# Patient Record
Sex: Female | Born: 1980 | Race: White | Hispanic: No | Marital: Single | Smoking: Current every day smoker
Health system: Southern US, Community
[De-identification: ages and names within clinical notes are randomized; demographics above are authoritative.]

## PROBLEM LIST (undated history)

## (undated) HISTORY — PX: BACK SURGERY: SHX140

## (undated) HISTORY — PX: TONSILLECTOMY: SUR1361

---

## 2018-05-07 ENCOUNTER — Emergency Department (HOSPITAL_COMMUNITY): Payer: Medicaid Other

## 2018-05-07 ENCOUNTER — Encounter (HOSPITAL_COMMUNITY): Payer: Self-pay | Admitting: Emergency Medicine

## 2018-05-07 ENCOUNTER — Other Ambulatory Visit: Payer: Self-pay

## 2018-05-07 ENCOUNTER — Emergency Department (HOSPITAL_COMMUNITY)
Admission: EM | Admit: 2018-05-07 | Discharge: 2018-05-07 | Disposition: A | Discharge status: Home / Self Care | Payer: Medicaid Other | Attending: Emergency Medicine | Admitting: Emergency Medicine

## 2018-05-07 DIAGNOSIS — K08409 Partial loss of teeth, unspecified cause, unspecified class: Secondary | ICD-10-CM

## 2018-05-07 DIAGNOSIS — K047 Periapical abscess without sinus: Secondary | ICD-10-CM

## 2018-05-07 DIAGNOSIS — F17210 Nicotine dependence, cigarettes, uncomplicated: Secondary | ICD-10-CM | POA: Insufficient documentation

## 2018-05-07 DIAGNOSIS — K0889 Other specified disorders of teeth and supporting structures: Secondary | ICD-10-CM | POA: Diagnosis not present

## 2018-05-07 MED ORDER — LIDOCAINE-EPINEPHRINE (PF) 2 %-1:200000 IJ SOLN
20.0000 mL | Freq: Once | INTRAMUSCULAR | Status: AC
  Administered 2018-05-07: 20 mL

## 2018-05-07 NOTE — ED Triage Notes (Signed)
Pt reports l lower dental pain x5 days.  Reports she was  referred by her dentist to Dr Teola Bradley who's going to do a dental extraction here in the ER

## 2018-05-07 NOTE — ED Provider Notes (Signed)
[name] EMERGENCY DEPARTMENT Provider Note   CSN: [id number] Arrival date & time: 05/07/18  4481    History   Chief Complaint Chief Complaint  Patient presents with  . Dental Pain    HPI Kimberly Ho is a 38 y.o. female.     38yo female presents with left lower dental pain. Patient was seen by her dentist and given amoxacillin and Motrin for her pain proximately 3 weeks ago, states that her pain had improved and then pain returned 4 days ago and she started on another course of amoxicillin.  Patient called her dentist who had referred her to see Dr. Barbette Merino with oral surgery in [facility] for ongoing pain however the office is closed due to coronavirus so patient was told to come to the emergency room and Dr. Barbette Merino would see her in the ER.  Patient feels like her left lower jaw is swollen however does not see swelling, denies drainage, denies fever, denies any previous trauma to the area.  No other complaints or concerns.     History reviewed. No pertinent past medical history.  There are no active problems to display for this patient.   Past Surgical History:  Procedure Laterality Date  . BACK SURGERY    . CESAREAN SECTION    . TONSILLECTOMY       OB History   No obstetric history on file.      Home Medications    Prior to Admission medications   Not on File    Family History No family history on file.  Social History Social History   Tobacco Use  . Smoking status: Current Every Day Smoker  . Smokeless tobacco: Never Used  Substance Use Topics  . Alcohol use: Yes  . Drug use: Never     Allergies   Patient has no known allergies.   Review of Systems Review of Systems  Constitutional: Negative for chills and fever.  HENT: Positive for dental problem. Negative for facial swelling, trouble swallowing and voice change.   Gastrointestinal: Negative for vomiting.  Musculoskeletal: Negative for neck pain and neck stiffness.   Skin: Negative for rash and wound.  Allergic/Immunologic: Negative for immunocompromised state.  Neurological: Negative for headaches.  Hematological: Negative for adenopathy.  Psychiatric/Behavioral: Negative for confusion.  All other systems reviewed and are negative.    Physical Exam Updated Vital Signs BP (!) 142/97 (BP Location: Right Arm)   Pulse 99   Temp 98.1 F (36.7 C) (Oral)   Resp 18   Ht 5' (1.524 m)   Wt 61.2 kg   LMP 05/07/2018   SpO2 100%   BMI 26.37 kg/m   Physical Exam Vitals signs and nursing note reviewed.  Constitutional:      General: She is not in acute distress.    Appearance: She is well-developed. She is not diaphoretic.  HENT:     Head: Normocephalic and atraumatic.     Jaw: No trismus.      Nose: Nose normal.     Mouth/Throat:     Mouth: Mucous membranes are moist.     Dentition: Dental caries present.     Pharynx: No oropharyngeal exudate or posterior oropharyngeal erythema.   Neck:     Musculoskeletal: Neck supple.  Pulmonary:     Effort: Pulmonary effort is normal.  Lymphadenopathy:     Cervical: No cervical adenopathy.  Neurological:     Mental Status: She is alert and oriented to person, place, and time.  Psychiatric:  Behavior: Behavior normal.      ED Treatments / Results  Labs (all labs ordered are listed, but only abnormal results are displayed) Labs Reviewed - No data to display  EKG None  Radiology Dg Orthopantogram  Result Date: 05/07/2018 CLINICAL DATA:  LEFT lower dental pain for days. EXAM: ORTHOPANTOGRAM/PANORAMIC COMPARISON:  None. FINDINGS: There is questionable periapical lucency surrounding the roots of a LEFT lower second molar, marked by an arrow. No definite dental caries. IMPRESSION: Questionable periapical lucency surrounding LEFT lower molar. Correlate clinically. Electronically Signed   By: Elsie Stain M.D.   On: 05/07/2018 10:10    Procedures Procedures (including critical care time)   Medications Ordered in ED Medications  lidocaine-EPINEPHrine (XYLOCAINE W/EPI) 2 %-1:200000 (PF) injection 20 mL (20 mLs Other Given 05/07/18 1114)     Initial Impression / Assessment and Plan / ED Course  I have reviewed the triage vital signs and the nursing notes.  Pertinent labs & imaging results that were available during my care of the patient were reviewed by me and considered in my medical decision making (see chart for details).  Clinical Course as of May 06 1116  Sat May 07, 2018  1453 38 year old female presents to the ER with left lower dental pain, called her dentist who reported Dr. Barbette Merino with oral surgery would come into the ER to see her.  Coming to Dr. Randa Evens call service, Dr. Barbette Merino is aware and will come and see the patient.   [LM]  1118 Patient was seen by Dr. Barbette Merino, tooth extracted.   [LM]    Clinical Course User Index [LM] Jeannie Fend, PA-C   Final Clinical Impressions(s) / ED Diagnoses   Final diagnoses:  Pain, dental  S/P tooth extraction    ED Discharge Orders    None       Jeannie Fend, PA-C 05/07/18 1118    Margarita Grizzle, MD 05/07/18 [phone]

## 2018-05-07 NOTE — H&P (Signed)
HISTORY AND PHYSICAL  Kimberly Ho is a 38 y.o. female patient with CC: dental pain  \HPI: Pain for 5 days. On amoxicillin, ibuprofen, tylenol, with no relief  1. Dental abscess     History reviewed. No pertinent past medical history.  Current Facility-Administered Medications  Medication Dose Route Frequency Provider Last Rate Last Dose  . lidocaine-EPINEPHrine (XYLOCAINE W/EPI) 2 %-1:200000 (PF) injection 20 mL  20 mL Other Once Jeannie Fend, PA-C       No current outpatient medications on file.   No Known Allergies Active Problems:   * No active hospital problems. *  Vitals: Blood pressure (!) 142/97, pulse 99, temperature 98.1 F (36.7 C), temperature source Oral, resp. rate 18, height 5' (1.524 m), weight 61.2 kg, last menstrual period 05/07/2018, SpO2 100 %. Lab results:No results found for this or any previous visit (from the past 24 hour(s)). Radiology Results: Dg Orthopantogram  Result Date: 05/07/2018 CLINICAL DATA:  LEFT lower dental pain for days. EXAM: ORTHOPANTOGRAM/PANORAMIC COMPARISON:  None. FINDINGS: There is questionable periapical lucency surrounding the roots of a LEFT lower second molar, marked by an arrow. No definite dental caries. IMPRESSION: Questionable periapical lucency surrounding LEFT lower molar. Correlate clinically. Electronically Signed   By: Elsie Stain M.D.   On: 05/07/2018 10:10   General appearance: alert, cooperative and no distress Head: Normocephalic, without obvious abnormality, atraumatic Eyes: negative Nose: Nares normal. Septum midline. Mucosa normal. No drainage or sinus tenderness. Throat: lips, mucosa, and tongue normal; teeth and gums normal and large carious lesion toolthb #18. No purulence,m edema, trismus. Neck: no adenopathy, supple, symmetrical, trachea midline and thyroid not enlarged, symmetric, no tenderness/mass/nodules  Assessment: Abscessed tooth #18  Plan: Extraction tooth #18. Local anesthesia   Ocie Doyne 05/07/2018

## 2018-05-07 NOTE — ED Notes (Signed)
Patient transported to X-ray 

## 2018-05-07 NOTE — Procedures (Signed)
Local anesthesia lidocaine 2% 1:200,000 epi x 5 cc left inferior alveolar block. Simple forcep extraction tooth #18. Tolerated well. No complications. EBL min. POI given

## 2018-05-07 NOTE — ED Notes (Signed)
Pt returns from radiology. 

## 2020-02-13 ENCOUNTER — Ambulatory Visit
Admission: EM | Admit: 2020-02-13 | Discharge: 2020-02-13 | Disposition: A | Discharge status: Home / Self Care | Payer: Medicaid Other | Attending: Family Medicine | Admitting: Family Medicine

## 2020-02-13 ENCOUNTER — Other Ambulatory Visit: Payer: Self-pay

## 2020-02-13 ENCOUNTER — Ambulatory Visit (INDEPENDENT_AMBULATORY_CARE_PROVIDER_SITE_OTHER): Payer: Medicaid Other

## 2020-02-13 DIAGNOSIS — R0682 Tachypnea, not elsewhere classified: Secondary | ICD-10-CM | POA: Diagnosis not present

## 2020-02-13 DIAGNOSIS — Z1152 Encounter for screening for COVID-19: Secondary | ICD-10-CM | POA: Diagnosis not present

## 2020-02-13 DIAGNOSIS — R0602 Shortness of breath: Secondary | ICD-10-CM | POA: Diagnosis not present

## 2020-02-13 DIAGNOSIS — R062 Wheezing: Secondary | ICD-10-CM | POA: Diagnosis not present

## 2020-02-13 DIAGNOSIS — J209 Acute bronchitis, unspecified: Secondary | ICD-10-CM

## 2020-02-13 DIAGNOSIS — R059 Cough, unspecified: Secondary | ICD-10-CM | POA: Diagnosis not present

## 2020-02-13 DIAGNOSIS — R5383 Other fatigue: Secondary | ICD-10-CM

## 2020-02-13 MED ORDER — METHYLPREDNISOLONE SODIUM SUCC 125 MG IJ SOLR
125.0000 mg | Freq: Once | INTRAMUSCULAR | Status: AC
  Administered 2020-02-13: 125 mg via INTRAMUSCULAR

## 2020-02-13 MED ORDER — PREDNISONE 10 MG (21) PO TBPK: ORAL_TABLET | Freq: Every day | ORAL | 0 refills | Status: AC

## 2020-02-13 MED ORDER — ALBUTEROL SULFATE HFA 108 (90 BASE) MCG/ACT IN AERS
2.0000 | INHALATION_SPRAY | Freq: Once | RESPIRATORY_TRACT | Status: AC
  Administered 2020-02-13: 2 via RESPIRATORY_TRACT

## 2020-02-13 MED ORDER — CHERATUSSIN AC 100-10 MG/5ML PO SOLN: 5.0000 mL | Freq: Three times a day (TID) | ORAL | 0 refills | Status: AC | PRN

## 2020-02-13 NOTE — Discharge Instructions (Signed)
Chest xray is negative for pneumonia  You have received a steroid injection in the office today  You have received albuterol in the office today  I have sent in a prednisone taper for you to take for 6 days. 6 tablets on day one, 5 tablets on day two, 4 tablets on day three, 3 tablets on day four, 2 tablets on day five, and 1 tablet on day six.  I have sent in cough syrup for you to take. This medication can make you sleepy. Do not drive while taking this medication.  Your COVID and Flu tests are pending.  You should self quarantine until the test results are back.    Take Tylenol or ibuprofen as needed for fever or discomfort.  Rest and keep yourself hydrated.    Follow-up with your primary care provider if your symptoms are not improving.

## 2020-02-13 NOTE — ED Triage Notes (Signed)
Pt presents with complaints of shortness of breath and cough that started on 12/23. Reports cough is dry and she is unable to cough much up. Denies any fever. Pt is not concerned for COVID.

## 2020-02-13 NOTE — ED Provider Notes (Incomplete)
[facility]    CSN: [id number] Arrival date & time: 02/13/20  1804      History   Chief Complaint Chief Complaint  Patient presents with  . Shortness of Breath    HPI Kimberly Ho is a 40 y.o. female.   HPI  History reviewed. No pertinent past medical history.  There are no problems to display for this patient.   Past Surgical History:  Procedure Laterality Date  . BACK SURGERY    . CESAREAN SECTION    . TONSILLECTOMY      OB History   No obstetric history on file.      Home Medications    Prior to Admission medications   Medication Sig Start Date End Date Taking? Authorizing Provider  guaiFENesin-codeine (CHERATUSSIN AC) 100-10 MG/5ML syrup Take 5 mLs by mouth 3 (three) times daily as needed for cough. 02/13/20  Yes Moshe Cipro, NP  predniSONE (STERAPRED UNI-PAK 21 TAB) 10 MG (21) TBPK tablet Take by mouth daily for 6 days. Take 6 tablets on day 1, 5 tablets on day 2, 4 tablets on day 3, 3 tablets on day 4, 2 tablets on day 5, 1 tablet on day 6 02/13/20 02/19/20 Yes Moshe Cipro, NP    Family History Family History  Problem Relation Age of Onset  . Heart disease Mother   . Diabetes Father     Social History Social History   Tobacco Use  . Smoking status: Current Every Day Smoker  . Smokeless tobacco: Never Used  Substance Use Topics  . Alcohol use: Yes  . Drug use: Never     Allergies   Patient has no known allergies.   Review of Systems Review of Systems   Physical Exam Triage Vital Signs ED Triage Vitals  Enc Vitals Group     BP 02/13/20 1947 126/87     Pulse Rate 02/13/20 1947 95     Resp 02/13/20 1947 (!) 26     Temp 02/13/20 1947 99 F (37.2 C)     Temp src --      SpO2 02/13/20 1947 91 %     Weight --      Height --      Head Circumference --      Peak Flow --      Pain Score 02/13/20 1945 0     Pain Loc --      Pain Edu? --      Excl. in GC? --    No data found.  Updated Vital Signs BP  126/87   Pulse 95   Temp 99 F (37.2 C)   Resp (!) 26   LMP 02/13/2020   SpO2 91%   Visual Acuity Right Eye Distance:   Left Eye Distance:   Bilateral Distance:    Right Eye Near:   Left Eye Near:    Bilateral Near:     Physical Exam   UC Treatments / Results  Labs (all labs ordered are listed, but only abnormal results are displayed) Labs Reviewed  COVID-19, FLU A+B NAA    EKG   Radiology DG Chest 2 View  Result Date: 02/13/2020 CLINICAL DATA:  Shortness of breath EXAM: CHEST - 2 VIEW COMPARISON:  None. FINDINGS: Central airways thickening. No focal consolidation or effusion. Normal heart size. No pneumothorax. IMPRESSION: Central airways thickening suggesting bronchitis or reactive airways. No focal pulmonary infiltrate. Electronically Signed   By: Jasmine Pang M.D.   On: 02/13/2020 20:09  Procedures Procedures (including critical care time)  Medications Ordered in UC Medications  methylPREDNISolone sodium succinate (SOLU-MEDROL) 125 mg/2 mL injection 125 mg (125 mg Intramuscular Given 02/13/20 2004)  albuterol (VENTOLIN HFA) 108 (90 Base) MCG/ACT inhaler 2 puff (2 puffs Inhalation Given 02/13/20 2004)    Initial Impression / Assessment and Plan / UC Course  I have reviewed the triage vital signs and the nursing notes.  Pertinent labs & imaging results that were available during my care of the patient were reviewed by me and considered in my medical decision making (see chart for details).     *** Final Clinical Impressions(s) / UC Diagnoses   Final diagnoses:  Tachypnea  SOB (shortness of breath)  Cough  Other fatigue  Wheezing  Acute bronchitis, unspecified organism  Encounter for screening for COVID-19     Discharge Instructions     Chest xray is negative for pneumonia  You have received a steroid injection in the office today  You have received albuterol in the office today  I have sent in a prednisone taper for you to take for 6 days. 6  tablets on day one, 5 tablets on day two, 4 tablets on day three, 3 tablets on day four, 2 tablets on day five, and 1 tablet on day six.  I have sent in cough syrup for you to take. This medication can make you sleepy. Do not drive while taking this medication.  Your COVID and Flu tests are pending.  You should self quarantine until the test results are back.    Take Tylenol or ibuprofen as needed for fever or discomfort.  Rest and keep yourself hydrated.    Follow-up with your primary care provider if your symptoms are not improving.       ED Prescriptions    Medication Sig Dispense Auth. Provider   predniSONE (STERAPRED UNI-PAK 21 TAB) 10 MG (21) TBPK tablet Take by mouth daily for 6 days. Take 6 tablets on day 1, 5 tablets on day 2, 4 tablets on day 3, 3 tablets on day 4, 2 tablets on day 5, 1 tablet on day 6 21 tablet Moshe Cipro, NP   guaiFENesin-codeine (CHERATUSSIN AC) 100-10 MG/5ML syrup Take 5 mLs by mouth 3 (three) times daily as needed for cough. 120 mL Moshe Cipro, NP     PDMP not reviewed this encounter.

## 2020-02-16 LAB — COVID-19, FLU A+B NAA
Influenza A, NAA: NOT DETECTED
Influenza B, NAA: NOT DETECTED
SARS-CoV-2, NAA: NOT DETECTED

## 2020-02-16 NOTE — ED Provider Notes (Signed)
[facility]   [id number] 02/13/20 Arrival Time: 1804   CC: COVID symptoms  SUBJECTIVE: History from: patient.  Kimberly Ho is a 40 y.o. female who presents with SOB, cough, congestion x 10 days. Denies sick exposure to COVID, flu or strep. Denies recent travel. Has negative history of Covid. Has not completed Covid vaccines. Cough and SOB are wore with activity. Has not taken OTC medications for this. There are no aggravating or alleviating factors. Denies previous symptoms in the past. Denies fever, chills, sinus pain, rhinorrhea, sore throat, SOB, wheezing, chest pain, nausea, changes in bowel or bladder habits.    ROS: As per HPI.  All other pertinent ROS negative.     History reviewed. No pertinent past medical history. Past Surgical History:  Procedure Laterality Date  . BACK SURGERY    . CESAREAN SECTION    . TONSILLECTOMY     No Known Allergies No current facility-administered medications on file prior to encounter.   No current outpatient medications on file prior to encounter.   Social History   Socioeconomic History  . Marital status: Single    Spouse name: Not on file  . Number of children: Not on file  . Years of education: Not on file  . Highest education level: Not on file  Occupational History  . Not on file  Tobacco Use  . Smoking status: Current Every Day Smoker  . Smokeless tobacco: Never Used  Substance and Sexual Activity  . Alcohol use: Yes  . Drug use: Never  . Sexual activity: Not on file  Other Topics Concern  . Not on file  Social History Narrative  . Not on file   Social Determinants of Health   Financial Resource Strain: Not on file  Food Insecurity: Not on file  Transportation Needs: Not on file  Physical Activity: Not on file  Stress: Not on file  Social Connections: Not on file  Intimate Partner Violence: Not on file   Family History  Problem Relation Age of Onset  . Heart disease Mother   . Diabetes Father      OBJECTIVE:  Vitals:   02/13/20 1947  BP: 126/87  Pulse: 95  Resp: (!) 26  Temp: 99 F (37.2 C)  SpO2: 91%     General appearance: alert; appears fatigued, but nontoxic; speaking in full sentences and tolerating own secretions HEENT: NCAT; Ears: EACs clear, TMs pearly gray; Eyes: PERRL.  EOM grossly intact. Sinuses: nontender; Nose: nares patent with clear rhinorrhea, Throat: oropharynx erythematous, cobblestoning present, tonsils non erythematous or enlarged, uvula midline  Neck: supple without LAD Lungs: unlabored respirations, symmetrical air entry; cough: moderate; no respiratory distress; diffuse wheezing throughout bilateral lung fields, diminished lung sounds to bilateral bases Heart: regular rate and rhythm.  Radial pulses 2+ symmetrical bilaterally Skin: warm and dry Psychological: alert and cooperative; normal mood and affect  LABS:  No results found for this or any previous visit (from the past 24 hour(s)).   ASSESSMENT & PLAN:  1. Acute bronchitis, unspecified organism   2. Tachypnea   3. SOB (shortness of breath)   4. Cough   5. Other fatigue   6. Wheezing   7. Encounter for screening for COVID-19     Meds ordered this encounter  Medications  . methylPREDNISolone sodium succinate (SOLU-MEDROL) 125 mg/2 mL injection 125 mg  . albuterol (VENTOLIN HFA) 108 (90 Base) MCG/ACT inhaler 2 puff  . predniSONE (STERAPRED UNI-PAK 21 TAB) 10 MG (21) TBPK tablet  Sig: Take by mouth daily for 6 days. Take 6 tablets on day 1, 5 tablets on day 2, 4 tablets on day 3, 3 tablets on day 4, 2 tablets on day 5, 1 tablet on day 6    Dispense:  21 tablet    Refill:  0    Order Specific Question:   Supervising Provider    Answer:   Merrilee Jansky [id number]  . guaiFENesin-codeine (CHERATUSSIN AC) 100-10 MG/5ML syrup    Sig: Take 5 mLs by mouth 3 (three) times daily as needed for cough.    Dispense:  120 mL    Refill:  0    Order Specific Question:   Supervising Provider     Answer:   Merrilee Jansky [[id number]]   Solumedrol 125mg  IM in office today Albuterol inhaler in office today Prescribed steroid taper Prescribed Cheratussin  Continue supportive care at home COVID and flu testing ordered.  It will take between 2-3 days for test results. Someone will contact you regarding abnormal results.   Work note provided Patient should remain in quarantine until they have received Covid results.  If negative you may resume normal activities (go back to work/school) while practicing hand hygiene, social distance, and mask wearing.  If positive, patient should remain in quarantine for at least 5 days from symptom onset AND greater than 72 hours after symptoms resolution (absence of fever without the use of fever-reducing medication and improvement in respiratory symptoms), whichever is longer Get plenty of rest and push fluids Use OTC zyrtec for nasal congestion, runny nose, and/or sore throat Use OTC flonase for nasal congestion and runny nose Use medications daily for symptom relief Use OTC medications like ibuprofen or tylenol as needed fever or pain Call or go to the ED if you have any new or worsening symptoms such as fever, worsening cough, shortness of breath, chest tightness, chest pain, turning blue, changes in mental status.  Reviewed expectations re: course of current medical issues. Questions answered. Outlined signs and symptoms indicating need for more acute intervention. Patient verbalized understanding. After Visit Summary given.         , NP 02/16/20 1300

## 2021-07-29 IMAGING — DX DG CHEST 2V
2 series · 2 of 2 positions shown · non-contrast
Comparison: None.

CLINICAL DATA: Shortness of breath

EXAM:
CHEST - 2 VIEW

[chest pa]
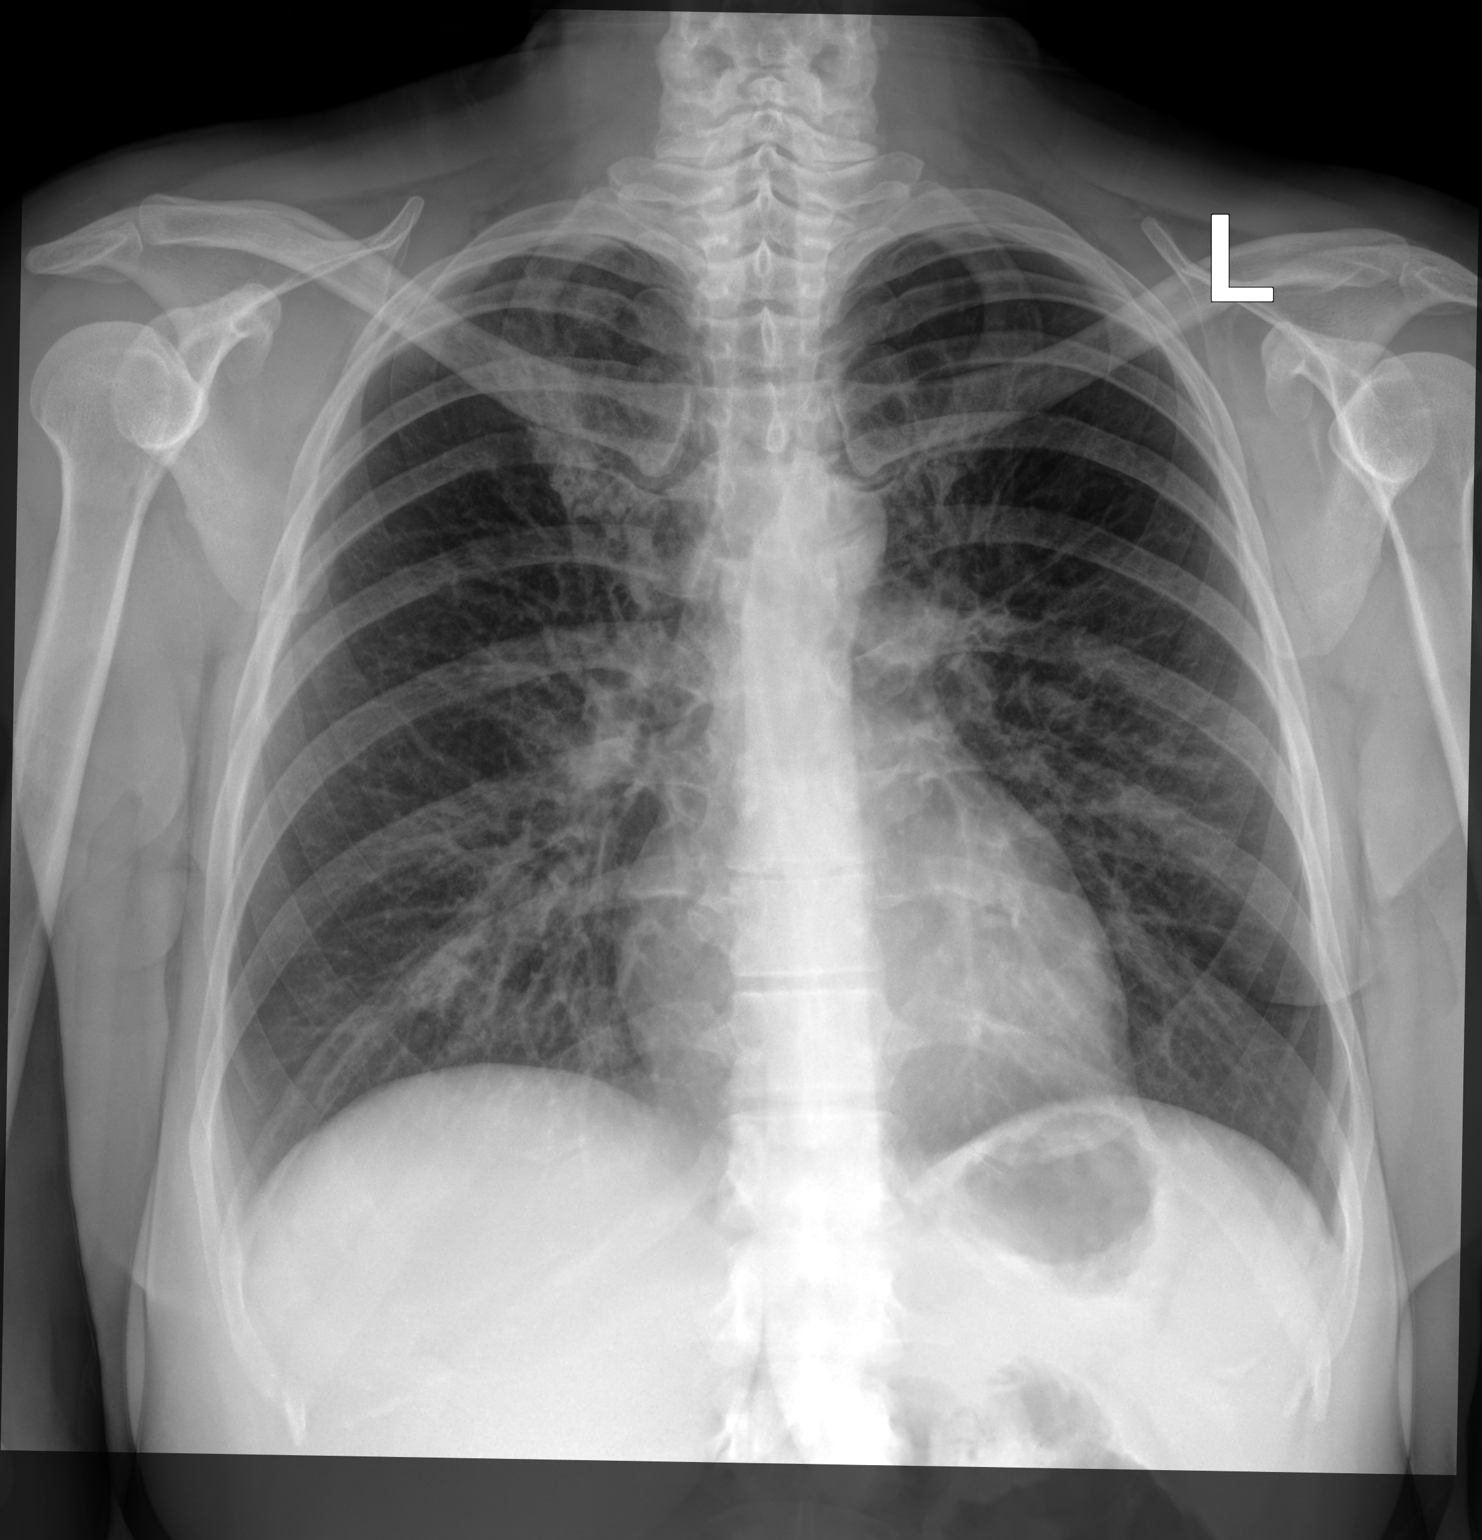

[chest lat]
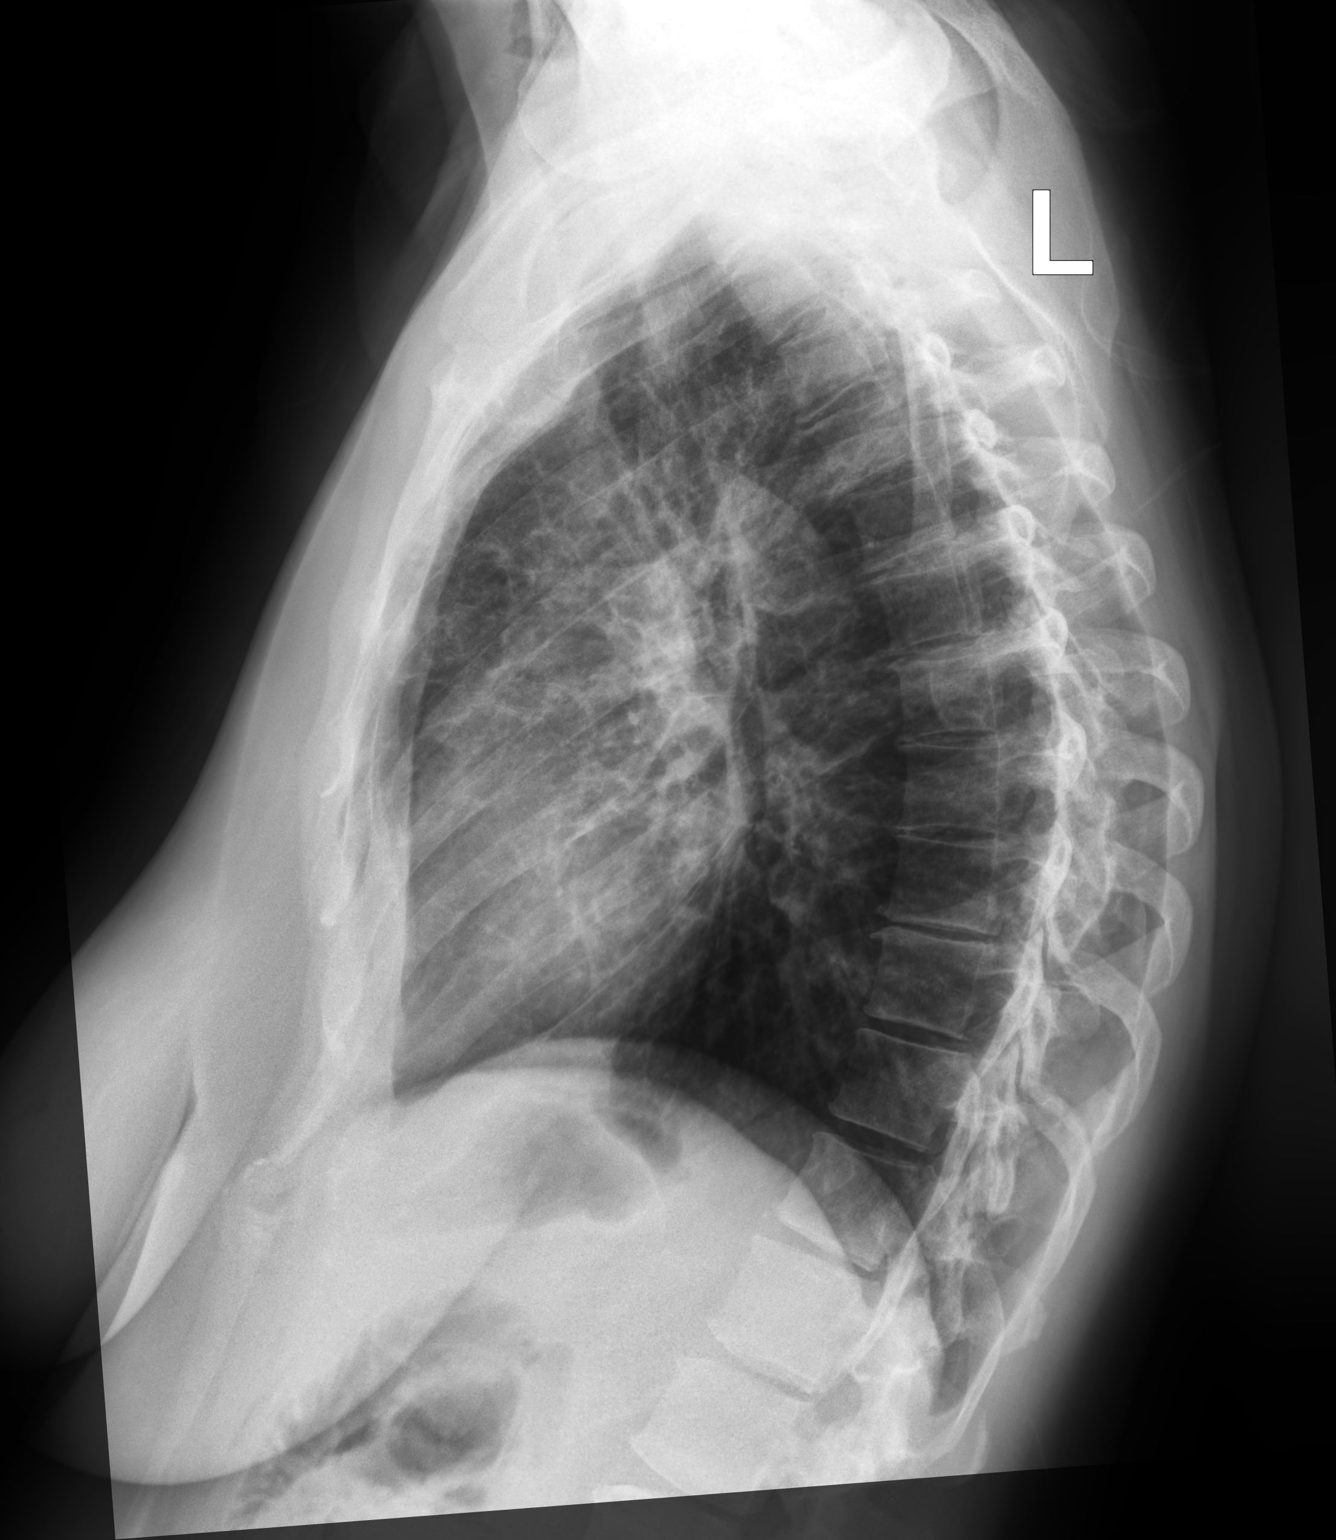

[2 of 2 positions shown; findings below may reference images not displayed]

FINDINGS: Central airways thickening. No focal consolidation or effusion.
Normal heart size. No pneumothorax.
IMPRESSION: Central airways thickening suggesting bronchitis or reactive
airways. No focal pulmonary infiltrate.
